# Patient Record
Sex: Male | Born: 1980 | Race: Black or African American | Hispanic: No | Marital: Single
Health system: Southern US, Community
[De-identification: ages and names within clinical notes are randomized; demographics above are authoritative.]

## PROBLEM LIST (undated history)

## (undated) DIAGNOSIS — I10 Essential (primary) hypertension: Secondary | ICD-10-CM

## (undated) HISTORY — DX: Essential (primary) hypertension: I10

---

## 2005-01-09 ENCOUNTER — Emergency Department (HOSPITAL_COMMUNITY): Admission: EM | Admit: 2005-01-09 | Discharge: 2005-01-09 | Payer: Self-pay | Admitting: Emergency Medicine

## 2006-07-27 IMAGING — CT CT ABDOMEN W/O CM
1 of 2 series · 15 of 32 positions shown, 20 images · IV contrast (agent unspecified)
Comparison: None

CLINICAL DATA: Hematuria, low back pain.
 ABDOMEN CT WITHOUT CONTRAST:
TECHNIQUE: Multidetector CT imaging of the abdomen was performed following the standard protocol without IV contrast.
TECHNIQUE: Multidetector CT imaging of the pelvis was performed following the standard protocol without IV contrast.

[Series 2: abd/pelv w/o 5.0 b31f st · axial · non-contrast · 0.59mm/px · z∈[-462,-106]mm · 15 of 79 slices shown, 20 images]
[im 4/79  soft-tissue]
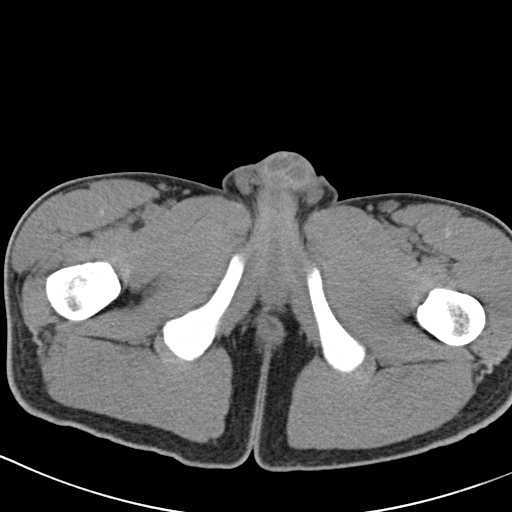
[im 4/79  bone]
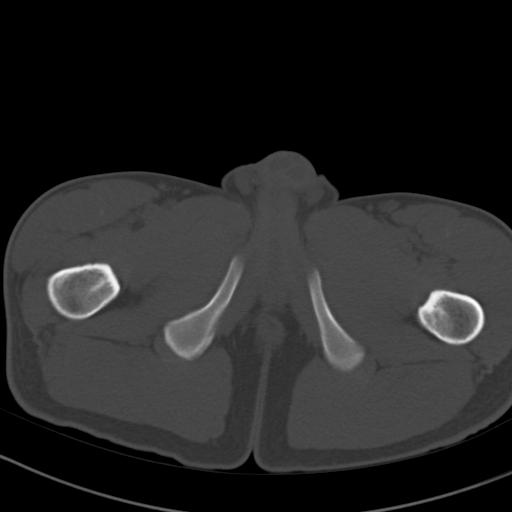
[im 11/79  soft-tissue]
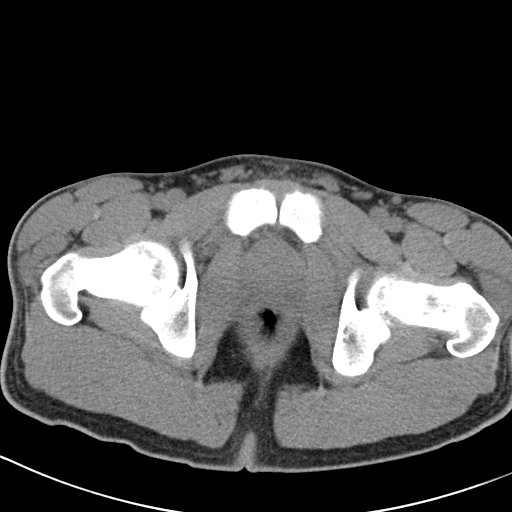
[im 14/79  soft-tissue]
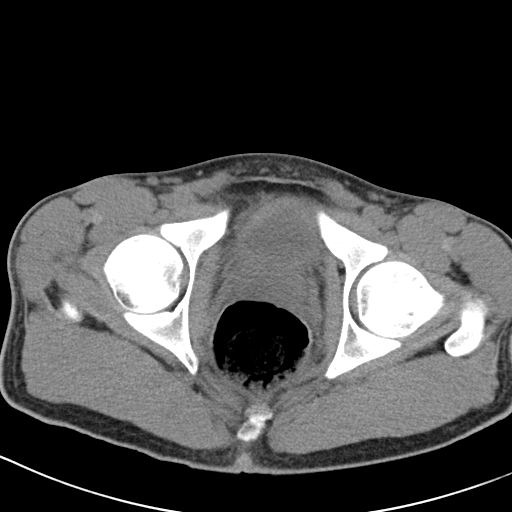
[im 21/79  soft-tissue]
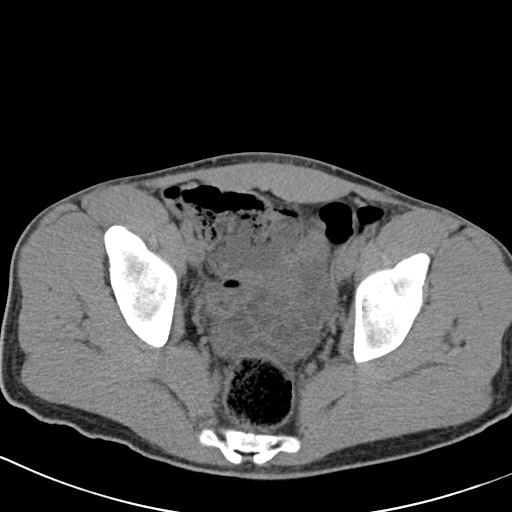
[im 28/79  soft-tissue]
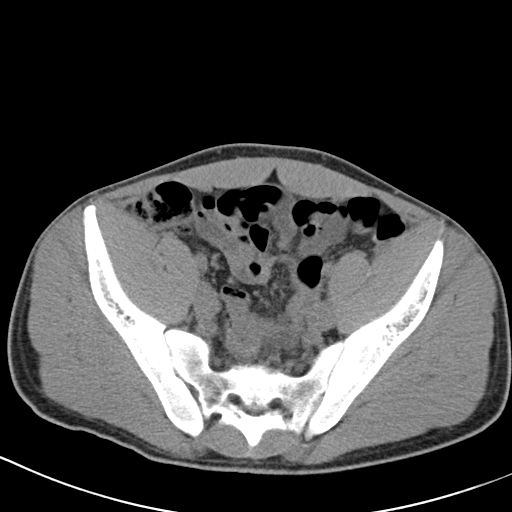
[im 31/79  soft-tissue]
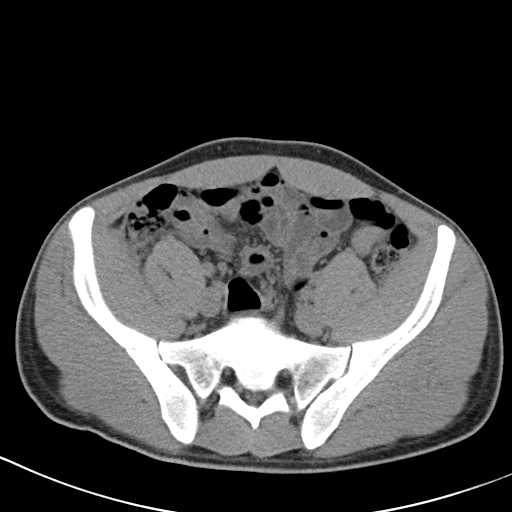
[im 38/79  soft-tissue]
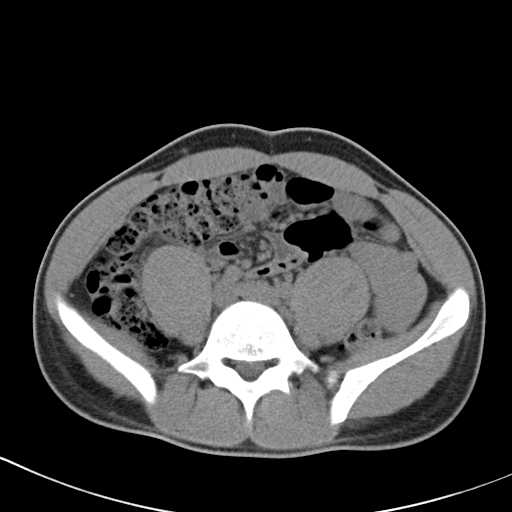
[im 41/79  soft-tissue]
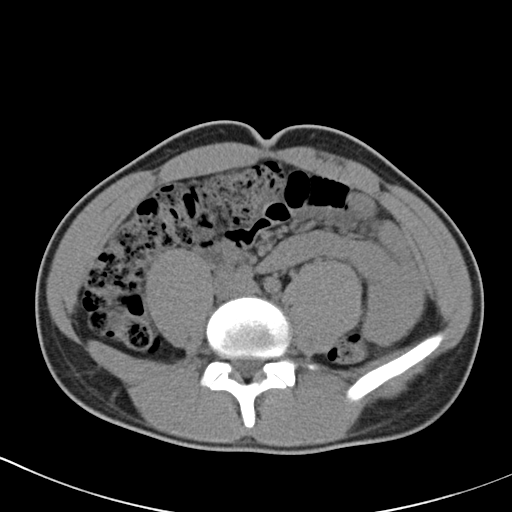
[im 48/79  soft-tissue]
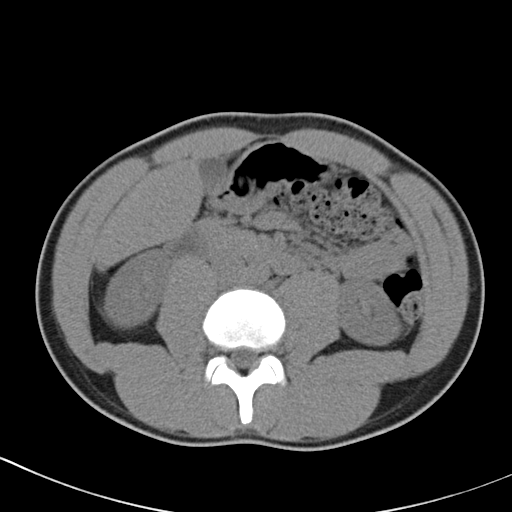
[im 48/79  bone]
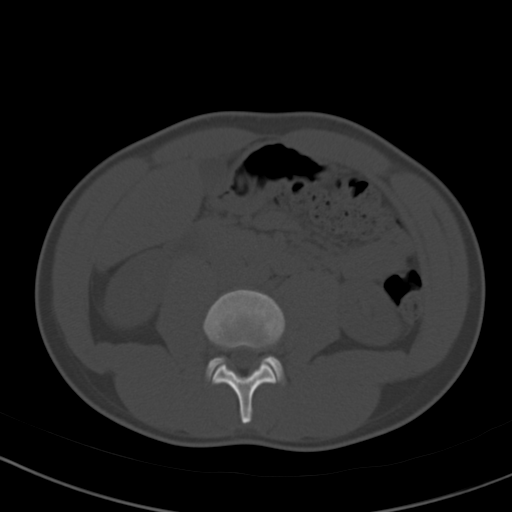
[im 51/79  soft-tissue]
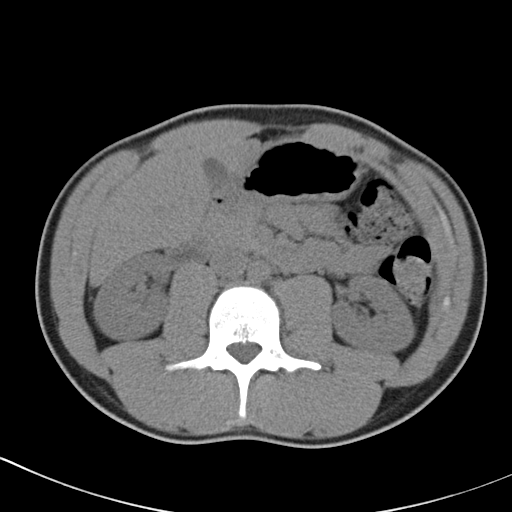
[im 58/79  soft-tissue]
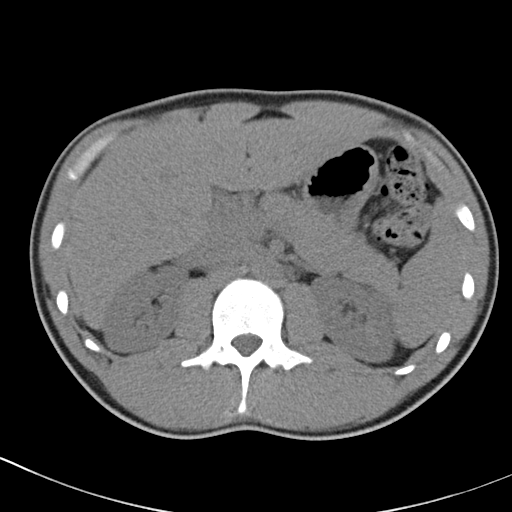
[im 65/79  soft-tissue]
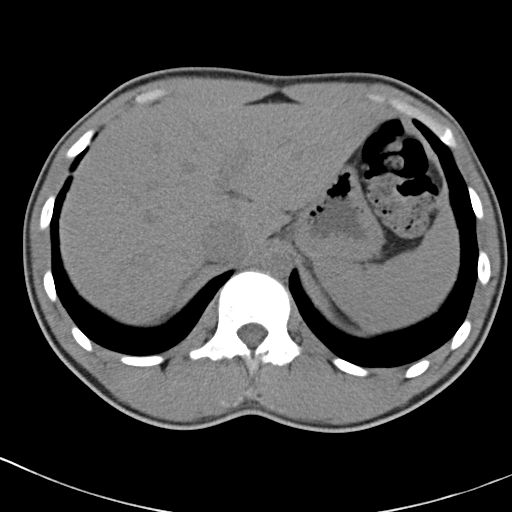
[im 65/79  lung]
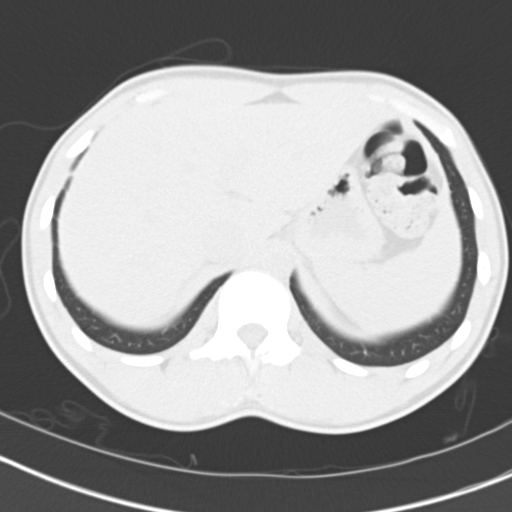
[im 68/79  soft-tissue]
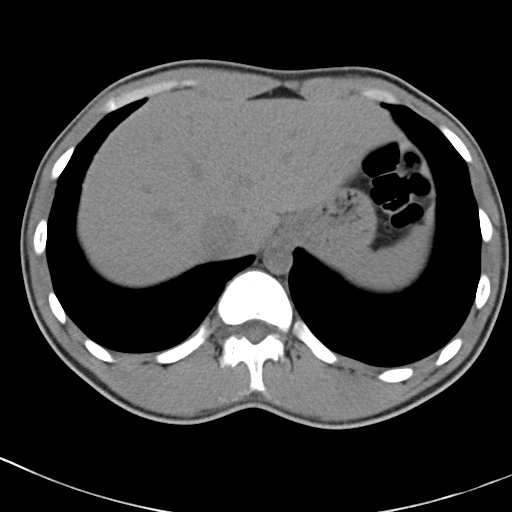
[im 68/79  lung]
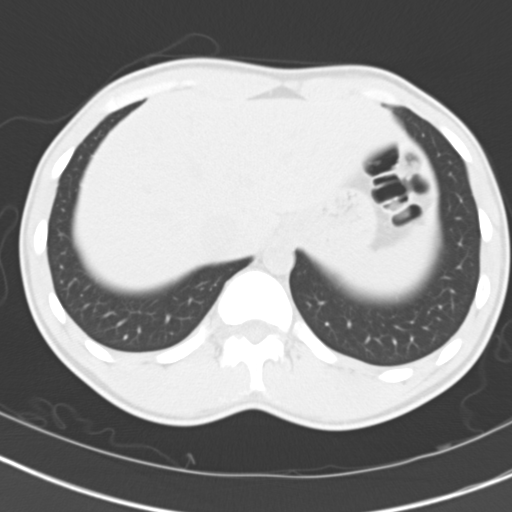
[im 72/79  lung]
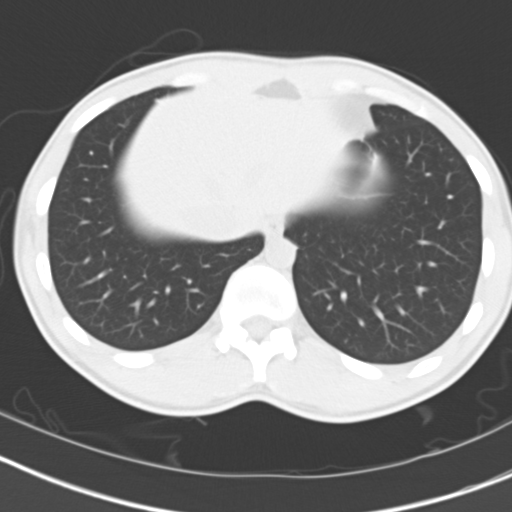
[im 75/79  soft-tissue]
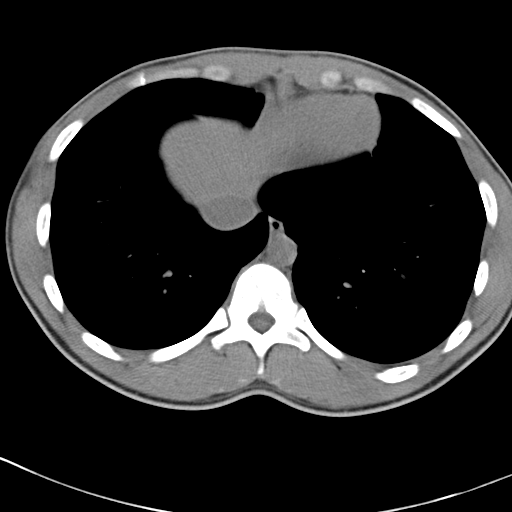
[im 75/79  lung]
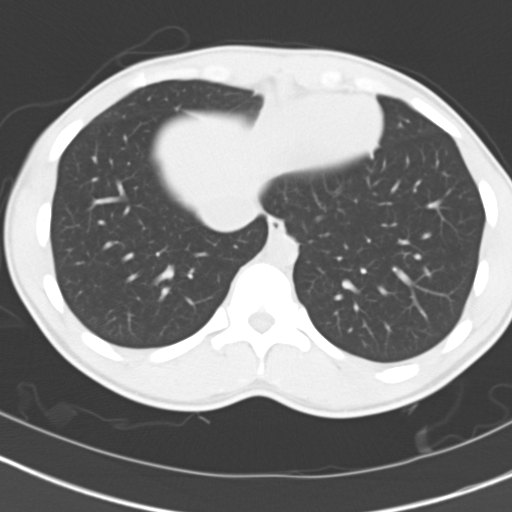

[15 of 32 positions shown; findings below may reference images not displayed]

FINDINGS: The visualized lung bases are clear.  No pleural or pericardial effusion.
 Within the limitations of non-contrast technique, the liver, spleen, pancreas, and adrenal glands are all unremarkable.   There is no evidence for nephrolithiasis or hydronephrosis.  
 The visualized bowel loops are unremarkable.
IMPRESSION: No acute abdomen CT findings.
 PELVIS CT WITHOUT CONTRAST:
FINDINGS: No ureteral or bladder calculi noted.  No free pelvic fluid is noted.  There is no pathologically enlarged pelvic lymphadenopathy.  
 Review of bone windows is unremarkable.
IMPRESSION: No acute findings.

## 2022-01-10 ENCOUNTER — Emergency Department (HOSPITAL_COMMUNITY)
Admission: EM | Admit: 2022-01-10 | Discharge: 2022-01-11 | Disposition: A | Discharge status: Home / Self Care | Payer: BC Managed Care – PPO | Attending: Student | Admitting: Student

## 2022-01-10 ENCOUNTER — Encounter (HOSPITAL_COMMUNITY): Payer: Self-pay | Admitting: Emergency Medicine

## 2022-01-10 ENCOUNTER — Emergency Department (HOSPITAL_COMMUNITY): Payer: BC Managed Care – PPO

## 2022-01-10 ENCOUNTER — Other Ambulatory Visit: Payer: Self-pay

## 2022-01-10 DIAGNOSIS — I10 Essential (primary) hypertension: Secondary | ICD-10-CM | POA: Insufficient documentation

## 2022-01-10 DIAGNOSIS — D649 Anemia, unspecified: Secondary | ICD-10-CM | POA: Insufficient documentation

## 2022-01-10 DIAGNOSIS — R519 Headache, unspecified: Secondary | ICD-10-CM | POA: Diagnosis present

## 2022-01-10 LAB — CBC
HCT: 39.5 % (ref 39.0–52.0)
Hemoglobin: 12.8 g/dL — ABNORMAL LOW (ref 13.0–17.0)
MCH: 28.4 pg (ref 26.0–34.0)
MCHC: 32.4 g/dL (ref 30.0–36.0)
MCV: 87.6 fL (ref 80.0–100.0)
Platelets: 281 10*3/uL (ref 150–400)
RBC: 4.51 MIL/uL (ref 4.22–5.81)
RDW: 12.6 % (ref 11.5–15.5)
WBC: 5.3 10*3/uL (ref 4.0–10.5)
nRBC: 0 % (ref 0.0–0.2)

## 2022-01-10 LAB — BASIC METABOLIC PANEL
Anion gap: 12 (ref 5–15)
BUN: 10 mg/dL (ref 6–20)
CO2: 24 mmol/L (ref 22–32)
Calcium: 9.4 mg/dL (ref 8.9–10.3)
Chloride: 102 mmol/L (ref 98–111)
Creatinine, Ser: 1.16 mg/dL (ref 0.61–1.24)
GFR, Estimated: >60 mL/min (ref >60)
Glucose, Bld: 142 mg/dL — ABNORMAL HIGH (ref 70–99)
Potassium: 3.5 mmol/L (ref 3.5–5.1)
Sodium: 138 mmol/L (ref 135–145)

## 2022-01-10 LAB — TROPONIN I (HIGH SENSITIVITY)
Troponin I (High Sensitivity): 4 ng/L (ref <18)
Troponin I (High Sensitivity): 4 ng/L (ref <18)

## 2022-01-10 NOTE — ED Triage Notes (Signed)
Patient here with complaint of left arm pain and headache since Wednesday this week. Patient states pain waxes and wanes but never resolves completely. Patient denies chest pain, denies shortness of breath, reports mild intermittent nausea. Patient is alert, oriented, speaking in complete sentences.

## 2022-01-11 ENCOUNTER — Emergency Department (HOSPITAL_COMMUNITY): Payer: BC Managed Care – PPO

## 2022-01-11 MED ORDER — KETOROLAC TROMETHAMINE 15 MG/ML IJ SOLN
15.0000 mg | Freq: Once | INTRAMUSCULAR | Status: AC
  Administered 2022-01-11: 15 mg via INTRAVENOUS
  Filled 2022-01-11: qty 1

## 2022-01-11 MED ORDER — DIPHENHYDRAMINE HCL 50 MG/ML IJ SOLN
25.0000 mg | Freq: Once | INTRAMUSCULAR | Status: AC
  Administered 2022-01-11: 25 mg via INTRAVENOUS
  Filled 2022-01-11: qty 1

## 2022-01-11 MED ORDER — PROCHLORPERAZINE EDISYLATE 10 MG/2ML IJ SOLN
10.0000 mg | Freq: Once | INTRAMUSCULAR | Status: AC
  Administered 2022-01-11: 10 mg via INTRAVENOUS
  Filled 2022-01-11: qty 2

## 2022-01-11 MED ORDER — LACTATED RINGERS IV BOLUS
1000.0000 mL | Freq: Once | INTRAVENOUS | Status: AC
  Administered 2022-01-11: 1000 mL via INTRAVENOUS

## 2022-01-11 NOTE — ED Provider Notes (Signed)
[facility] EMERGENCY DEPARTMENT Provider Note  CSN: [id number] Arrival date & time: 01/10/22 1511  Chief Complaint(s) Headache  HPI Troy Bartlett is a 41 y.o. male with PMH HTN who presents emerged department for evaluation of a headache.  Patient states that for the last 5 days he has had a persistent and gradually worsening headache.  He denies numbness, tingling, weakness or other neurologic complaints.  Denies associated nausea, vomiting, chest pain, shortness of breath or other systemic symptoms.  States that he was seen in an urgent care and tested negative for COVID and flu a few days ago.   Past Medical History Past Medical History:  Diagnosis Date   Hypertension    There are no problems to display for this patient.  Home Medication(s) Prior to Admission medications   Not on File                                                                                                                                    Past Surgical History  Family History No family history on file.  Social History   Allergies Patient has no known allergies.  Review of Systems Review of Systems  Neurological:  Positive for headaches.    Physical Exam Vital Signs  I have reviewed the triage vital signs BP (!) 131/93 (BP Location: Right Arm)   Pulse 68   Temp 98.5 F (36.9 C) (Oral)   Resp 14   SpO2 96%   Physical Exam Constitutional:      General: He is not in acute distress.    Appearance: Normal appearance.  HENT:     Head: Normocephalic and atraumatic.     Nose: No congestion or rhinorrhea.  Eyes:     General:        Right eye: No discharge.        Left eye: No discharge.     Extraocular Movements: Extraocular movements intact.     Pupils: Pupils are equal, round, and reactive to light.  Cardiovascular:     Rate and Rhythm: Normal rate and regular rhythm.     Heart sounds: No murmur heard. Pulmonary:     Effort: No respiratory distress.     Breath  sounds: No wheezing or rales.  Abdominal:     General: There is no distension.     Tenderness: There is no abdominal tenderness.  Musculoskeletal:        General: Normal range of motion.     Cervical back: Normal range of motion.  Skin:    General: Skin is warm and dry.  Neurological:     General: No focal deficit present.     Mental Status: He is alert.     Cranial Nerves: No cranial nerve deficit or dysarthria.     Sensory: No sensory deficit.     Motor: No weakness.     ED Results and Treatments Labs (  all labs ordered are listed, but only abnormal results are displayed) Labs Reviewed  BASIC METABOLIC PANEL - Abnormal; Notable for the following components:      Result Value   Glucose, Bld 142 (*)    All other components within normal limits  CBC - Abnormal; Notable for the following components:   Hemoglobin 12.8 (*)    All other components within normal limits  TROPONIN I (HIGH SENSITIVITY)  TROPONIN I (HIGH SENSITIVITY)                                                                                                                          Radiology DG Chest 2 View  Result Date: 01/10/2022 CLINICAL DATA:  Chest pain, left arm pain, headache EXAM: CHEST - 2 VIEW COMPARISON:  None Available. FINDINGS: The heart size and mediastinal contours are within normal limits. Both lungs are clear. The visualized skeletal structures are unremarkable. IMPRESSION: No active cardiopulmonary disease. Electronically Signed   By: Sharlet Salina M.D.   On: 01/10/2022 15:56    Pertinent labs & imaging results that were available during my care of the patient were reviewed by me and considered in my medical decision making (see MDM for details).  Medications Ordered in ED Medications  lactated ringers bolus 1,000 mL (has no administration in time range)  prochlorperazine (COMPAZINE) injection 10 mg (10 mg Intravenous Given 01/11/22 0501)  diphenhydrAMINE (BENADRYL) injection 25 mg (25 mg  Intravenous Given 01/11/22 0502)                                                                                                                                     Procedures Procedures  (including critical care time)  Medical Decision Making / ED Course   This patient presents to the ED for concern of headache, this involves an extensive number of treatment options, and is a complaint that carries with it a high risk of complications and morbidity.  The differential diagnosis includes tension headache, migraine headache, intracranial mass, dehydration  MDM: Patient seen emergency room for evaluation of a headache.  Physical exam unremarkable with no focal motor or sensory deficits, no cranial nerve deficits.  Laboratory evaluation largely unremarkable outside of the mild anemia to 12.8.  CT head unremarkable.  Chest x-ray unremarkable.  Patient given headache cocktail and on reevaluation symptoms have resolved.  With overall reassuring emergency department work-up, patient  safe for discharge with outpatient follow-up.  He was given strict return precautions of which she voiced understanding.   Additional history obtained: -Additional history obtained from partner -External records from outside source obtained and reviewed including: Chart review including previous notes, labs, imaging, consultation notes   Lab Tests: -I ordered, reviewed, and interpreted labs.   The pertinent results include:   Labs Reviewed  BASIC METABOLIC PANEL - Abnormal; Notable for the following components:      Result Value   Glucose, Bld 142 (*)    All other components within normal limits  CBC - Abnormal; Notable for the following components:   Hemoglobin 12.8 (*)    All other components within normal limits  TROPONIN I (HIGH SENSITIVITY)  TROPONIN I (HIGH SENSITIVITY)      EKG   EKG Interpretation  Date/Time:  Sunday January 11 2022 05:02:24 EDT Ventricular Rate:  94 PR Interval:  163 QRS  Duration: 90 QT Interval:  362 QTC Calculation: 453 R Axis:   38 Text Interpretation: Sinus rhythm Confirmed by [place] ([id number]) on 01/11/2022 6:17:35 AM         Imaging Studies ordered: I ordered imaging studies including CT head, chest x-ray I independently visualized and interpreted imaging. I agree with the radiologist interpretation   Medicines ordered and prescription drug management: Meds ordered this encounter  Medications   prochlorperazine (COMPAZINE) injection 10 mg   diphenhydrAMINE (BENADRYL) injection 25 mg   lactated ringers bolus 1,000 mL    -I have reviewed the patients home medicines and have made adjustments as needed  Critical interventions none   Cardiac Monitoring: The patient was maintained on a cardiac monitor.  I personally viewed and interpreted the cardiac monitored which showed an underlying rhythm of: NSR  Social Determinants of Health:  Factors impacting patients care include: none   Reevaluation: After the interventions noted above, I reevaluated the patient and found that they have :improved  Co morbidities that complicate the patient evaluation  Past Medical History:  Diagnosis Date   Hypertension       Dispostion: I considered admission for this patient, but with overall reassuring work-up in the emergency department, patient safe for discharge with outpatient follow-up     Final Clinical Impression(s) / ED Diagnoses Final diagnoses:  None     @PCDICTATION @    Ricardo Schubach, Debe Coder, MD 01/11/22 [phone]
